# Patient Record
Sex: Male | Born: 1980 | Hispanic: No | Marital: Married | Smoking: Never smoker
Health system: Southern US, Community
[De-identification: ages and names within clinical notes are randomized; demographics above are authoritative.]

---

## 2016-04-18 ENCOUNTER — Ambulatory Visit (HOSPITAL_COMMUNITY)
Admission: EM | Admit: 2016-04-18 | Discharge: 2016-04-18 | Disposition: A | Discharge status: Home / Self Care | Payer: BLUE CROSS/BLUE SHIELD | Attending: Family Medicine | Admitting: Family Medicine

## 2016-04-18 ENCOUNTER — Encounter (HOSPITAL_COMMUNITY): Payer: Self-pay | Admitting: *Deleted

## 2016-04-18 ENCOUNTER — Ambulatory Visit (INDEPENDENT_AMBULATORY_CARE_PROVIDER_SITE_OTHER): Payer: BLUE CROSS/BLUE SHIELD

## 2016-04-18 DIAGNOSIS — F419 Anxiety disorder, unspecified: Secondary | ICD-10-CM | POA: Diagnosis not present

## 2016-04-18 DIAGNOSIS — M25512 Pain in left shoulder: Secondary | ICD-10-CM | POA: Diagnosis not present

## 2016-04-18 MED ORDER — LORAZEPAM 1 MG PO TABS: 1.0000 mg | ORAL_TABLET | Freq: Two times a day (BID) | ORAL | Status: AC | PRN

## 2016-04-18 NOTE — ED Notes (Signed)
Pt  Reports  Symptoms  Of  Anxious  Feeling    Sensation  Of tightness  In  Chest         denys  Any medical problems   denys  Any meds     He  Reports    He has  Never  Had  The  Symptoms   Before           his  Eye  Contact  Is  Fair  His  Skin is  Warm  And  Dry      He  Is    Alert  And  orieinted

## 2016-04-18 NOTE — ED Provider Notes (Signed)
CSN: [id number]     Arrival date & time 04/18/16  1744 History   None    Chief Complaint  Patient presents with  . Anxiety    Patient is a 35 y.o. male presenting with anxiety. The history is provided by the patient.  Anxiety This is a new problem. The current episode started more than 2 days ago. The problem occurs daily. The problem has been gradually worsening. Pertinent negatives include no chest pain, no abdominal pain, no headaches and no shortness of breath. Nothing relieves the symptoms.  Pt reports several days of feeling nervous and anxious. He is not sleeping well and often at night awakens and feels like he has to take a deep breath to calm himself. He admits to being under a lot of stress recently. No previos h/o anxiety issues.  Shoulder pain: Pt states he has noticed recently that he has pain with ROM of his (L) shoulder. Denies injury, tingling or numbness to LUE.  [facility].    History reviewed. No pertinent past medical history. History reviewed. No pertinent past surgical history. History reviewed. No pertinent family history. Social History  Substance Use Topics  . Smoking status: Never Smoker   . Smokeless tobacco: None  . Alcohol Use: No    Review of Systems  Respiratory: Negative for shortness of breath.   Cardiovascular: Negative for chest pain.  Gastrointestinal: Negative for abdominal pain.  Neurological: Negative for headaches.  All other systems reviewed and are negative.   Allergies  Review of patient's allergies indicates no known allergies.  Home Medications   Prior to Admission medications   Medication Sig Start Date End Date Taking? Authorizing Provider  LORazepam (ATIVAN) 1 MG tablet Take 1 tablet (1 mg total) by mouth 2 (two) times daily as needed for anxiety. 04/18/16   Leanne ChangKatherine P Schorr, NP   Meds Ordered and Administered this Visit  Medications - No data to display  BP 131/85 mmHg   Pulse 98  Temp(Src) 98.6 F (37 C) (Oral)  Resp 12  SpO2 99% No data found.   Physical Exam  Constitutional: He is oriented to person, place, and time. He appears well-developed and well-nourished. No distress.  HENT:  Head: Normocephalic.  Eyes: Conjunctivae are normal.  Cardiovascular: Normal rate and regular rhythm.   Pulmonary/Chest: Effort normal and breath sounds normal.  Neurological: He is alert and oriented to person, place, and time.  Skin: Skin is warm and dry.  Psychiatric: He has a normal mood and affect.    ED Course  Procedures (including critical care time)  Labs Review Labs Reviewed - No data to display  Imaging Review Dg Shoulder Left  04/18/2016  CLINICAL DATA:  Acute onset of left shoulder pain. Initial encounter. EXAM: LEFT SHOULDER - 2+ VIEW COMPARISON:  None. FINDINGS: There is no evidence of fracture or dislocation. The left humeral head is seated within the glenoid fossa. The acromioclavicular joint is unremarkable in appearance. No significant soft tissue abnormalities are seen. The visualized portions of the left lung are clear. IMPRESSION: No evidence of fracture or dislocation. Electronically Signed   By: Roanna RaiderJeffery  Chang M.D.   On: 04/18/2016 18:56     Visual Acuity Review  Right Eye Distance:   Left Eye Distance:   Bilateral Distance:    Right Eye Near:   Left Eye Near:    Bilateral Near:         MDM  1. Anxiety:  Recent increased life stressors. Ativan 1 mg BID PRN anxiety.  2. (L) shoulder pain: (L) shoulder xray neg. Encouraged to f/u w/ PCP on 05/04/2016 as schedueld and discuss these issues with PCP for ongoing management.     Leanne Chang, NP 04/18/16 2050

## 2016-04-18 NOTE — Discharge Instructions (Signed)
Generalized Anxiety Disorder Generalized anxiety disorder (GAD) is a mental disorder. It interferes with life functions, including relationships, work, and school. GAD is different from normal anxiety, which everyone experiences at some point in their lives in response to specific life events and activities. Normal anxiety actually helps [place] prepare for and get through these life events and activities. Normal anxiety goes away after the event or activity is over.  GAD causes anxiety that is not necessarily related to specific events or activities. It also causes excess anxiety in proportion to specific events or activities. The anxiety associated with GAD is also difficult to control. GAD can vary from mild to severe. People with severe GAD can have intense waves of anxiety with physical symptoms (panic attacks).  SYMPTOMS The anxiety and worry associated with GAD are difficult to control. This anxiety and worry are related to many life events and activities and also occur more days than not for 6 months or longer. People with GAD also have three or more of the following symptoms (one or more in children): 1. Restlessness.  2. Fatigue. 3. Difficulty concentrating.  4. Irritability. 5. Muscle tension. 6. Difficulty sleeping or unsatisfying sleep. DIAGNOSIS GAD is diagnosed through an assessment by your health care provider. Your health care provider will ask you questions aboutyour mood,physical symptoms, and events in your life. Your health care provider may ask you about your medical history and use of alcohol or drugs, including prescription medicines. Your health care provider may also do a physical exam and blood tests. Certain medical conditions and the use of certain substances can cause symptoms similar to those associated with GAD. Your health care provider may refer you to a mental health specialist for further evaluation. TREATMENT The following therapies are usually used to treat GAD:    1. Medication. Antidepressant medication usually is prescribed for long-term daily control. Antianxiety medicines may be added in severe cases, especially when panic attacks occur.  2. Talk therapy (psychotherapy). Certain types of talk therapy can be helpful in treating GAD by providing support, education, and guidance. A form of talk therapy called cognitive behavioral therapy can teach you healthy ways to think about and react to daily life events and activities. 3. Stress managementtechniques. These include yoga, meditation, and exercise and can be very helpful when they are practiced regularly. A mental health specialist can help determine which treatment is best for you. Some people see improvement with one therapy. However, other people require a combination of therapies.   This information is not intended to replace advice given to you by your health care provider. Make sure you discuss any questions you have with your health care provider.   Document Released: 03/05/2013 Document Revised: 11/29/2014 Document Reviewed: 03/05/2013 Elsevier Interactive Patient Education 2016 [employer].  Joint Pain Joint pain can be caused by many things. The joint can be bruised, infected, weak from aging, or sore from exercise. The pain will probably go away if you follow your doctor's instructions for home care. If your joint pain continues, more tests may be needed to help find the cause of your condition. HOME CARE Watch your condition for any changes. Follow these instructions as told to lessen the pain that you are feeling: 7. Take medicines only as told by your doctor. 8. Rest the sore joint for as long as told by your doctor. If your doctor tells you to, raise (elevate) the painful joint above the level of your heart while you are sitting or lying down.  9. Do not do things that cause pain or make the pain worse. 10. If told, put ice on the painful area: 1. Put ice in a plastic bag. 2. Place a  towel between your skin and the bag. 3. Leave the ice on for 20 minutes, 2-3 times per day. 11. Wear an elastic bandage, splint, or sling as told by your doctor. Loosen the bandage or splint if your fingers or toes lose feeling (become numb) and tingle, or if they turn cold and blue. 12. Begin exercising or stretching the joint as told by your doctor. Ask your doctor what types of exercise are safe for you. 13. Keep all follow-up visits as told by your doctor. This is important. GET HELP IF: 4. Your pain gets worse and medicine does not help it. 5. Your joint pain does not get better in 3 days. 6. You have more bruising or swelling. 7. You have a fever. 8. You lose 10 pounds (4.5 kg) or more without trying. GET HELP RIGHT AWAY IF: 1. You are not able to move the joint. 2. Your fingers or toes become numb or they turn cold and blue.   This information is not intended to replace advice given to you by your health care provider. Make sure you discuss any questions you have with your health care provider.   Document Released: 10/27/2009 Document Revised: 11/29/2014 Document Reviewed: 08/20/2014 Elsevier Interactive Patient Education 2016 [employer]  Panic Attacks Panic attacks are sudden, short feelings of great fear or discomfort. You may have them for no reason when you are relaxed, when you are uneasy (anxious), or when you are sleeping.  HOME CARE 14. Take all your medicines as told. 15. Check with your doctor before starting new medicines. 16. Keep all doctor visits. GET HELP IF: 9. You are not able to take your medicines as told. 10. Your symptoms do not get better. 11. Your symptoms get worse. GET HELP RIGHT AWAY IF: 3. Your attacks seem different than your normal attacks. 4. You have thoughts about hurting yourself or others. 5. You take panic attack medicine and you have a side effect. MAKE SURE YOU: 1. Understand these instructions. 2. Will watch your condition. 3. Will  get help right away if you are not doing well or get worse.   This information is not intended to replace advice given to you by your health care provider. Make sure you discuss any questions you have with your health care provider.   Document Released: 12/11/2010 Document Revised: 08/29/2013 Document Reviewed: 06/22/2013 Elsevier Interactive Patient Education 2016 [employer]  Shoulder Pain The shoulder is the joint that connects your arm to your body. Muscles and band-like tissues that connect bones to muscles (tendons) hold the joint together. Shoulder pain is felt if an injury or medical problem affects one or more parts of the shoulder. HOME CARE  17. Put ice on the sore area. 1. Put ice in a plastic bag. 2. Place a towel between your skin and the bag. 3. Leave the ice on for 15-20 minutes, 03-04 times a day for the first 2 days. 18. Stop using cold packs if they do not help with the pain. 19. If you were given something to keep your shoulder from moving (sling; shoulder immobilizer), wear it as told. Only take it off to shower or bathe. 20. Move your arm as little as possible, but keep your hand moving to prevent puffiness (swelling). 21. Squeeze a soft ball or foam  pad as much as possible to help prevent swelling. 22. Take medicine as told by your doctor. GET HELP IF: 12. You have progressing new pain in your arm, hand, or fingers. 13. Your hand or fingers get cold. 14. Your medicine does not help lessen your pain. GET HELP RIGHT AWAY IF:  6. Your arm, hand, or fingers are numb or tingling. 7. Your arm, hand, or fingers are puffy (swollen), painful, or turn white or blue. MAKE SURE YOU:  4. Understand these instructions. 5. Will watch your condition. 6. Will get help right away if you are not doing well or get worse.   This information is not intended to replace advice given to you by your health care provider. Make sure you discuss any questions you have with your health  care provider.   Document Released: 04/26/2008 Document Revised: 11/29/2014 Document Reviewed: 03/03/2015 Elsevier Interactive Patient Education 2016 [employer] Inc[employer]  Shoulder Range of Motion Exercises Shoulder range of motion (ROM) exercises are designed to keep the shoulder moving freely. They are often recommended for people who have shoulder pain. MOVEMENT EXERCISE When you are able, do this exercise 5-6 days per week, or as told by your health care provider. Work toward doing 2 sets of 10 swings. Pendulum Exercise How To Do This Exercise Lying Down 23. Lie face-down on a bed with your abdomen close to the side of the bed. 24. Let your arm hang over the side of the bed. 25. Relax your shoulder, arm, and hand. 26. Slowly and gently swing your arm forward and back. Do not use your neck muscles to swing your arm. They should be relaxed. If you are struggling to swing your arm, have someone gently swing it for you. When you do this exercise for the first time, swing your arm at a 15 degree angle for 15 seconds, or swing your arm 10 times. As pain lessens over time, increase the angle of the swing to 30-45 degrees. 27. Repeat steps 1-4 with the other arm. How To Do This Exercise While Standing 15. Stand next to a sturdy chair or table and hold on to it with your hand.  Bend forward at the waist.  Bend your knees slightly.  Relax your other arm and let it hang limp.  Relax the shoulder blade of the arm that is hanging and let it drop.  While keeping your shoulder relaxed, use body motion to swing your arm in small circles. The first time you do this exercise, swing your arm for about 30 seconds or 10 times. When you do it next time, swing your arm for a little longer.  Stand up tall and relax.  Repeat steps 1-7, this time changing the direction of the circles. 16. Repeat steps 1-8 with the other arm. STRETCHING EXERCISES Do these exercises 3-4 times per day on 5-6 days per week or as  told by your health care provider. Work toward holding the stretch for 20 seconds. Stretching Exercise 1 8. Lift your arm straight out in front of you. 9. Bend your arm 90 degrees at the elbow (right angle) so your forearm goes across your body and looks like the letter "L." 10. Use your other arm to gently pull the elbow forward and across your body. 11. Repeat steps 1-3 with the other arm. Stretching Exercise 2 You will need a towel or rope for this exercise. 7. Bend one arm behind your back with the palm facing outward. 8. Hold a towel with your  other hand. 9. Reach the arm that holds the towel above your head, and bend that arm at the elbow. Your wrist should be behind your neck. 10. Use your free hand to grab the free end of the towel. 11. With the higher hand, gently pull the towel up behind you. 12. With the lower hand, pull the towel down behind you. 13. Repeat steps 1-6 with the other arm. STRENGTHENING EXERCISES Do each of these exercises at four different times of day (sessions) every day or as told by your health care provider. To begin with, repeat each exercise 5 times (repetitions). Work toward doing 3 sets of 12 repetitions or as told by your health care provider. Strengthening Exercise 1 You will need a light weight for this activity. As you grow stronger, you may use a heavier weight. 1. Standing with a weight in your hand, lift your arm straight out to the side until it is at the same height as your shoulder. 2. Bend your arm at 90 degrees so that your fingers are pointing to the ceiling. 3. Slowly raise your hand until your arm is straight up in the air. 4. Repeat steps 1-3 with the other arm. Strengthening Exercise 2 You will need a light weight for this activity. As you grow stronger, you may use a heavier weight. 1. Standing with a weight in your hand, gradually move your straight arm in an arc, starting at your side, then out in front of you, then straight up over  your head. 2. Gradually move your other arm in an arc, starting at your side, then out in front of you, then straight up over your head. 3. Repeat steps 1-2 with the other arm. Strengthening Exercise 3 You will need an elastic band for this activity. As you grow stronger, gradually increase the size of the bands or increase the number of bands that you use at one time. 1. While standing, hold an elastic band in one hand and raise that arm up in the air. 2. With your other hand, pull down the band until that hand is by your side. 3. Repeat steps 1-2 with the other arm.   This information is not intended to replace advice given to you by your health care provider. Make sure you discuss any questions you have with your health care provider.   Document Released: 08/07/2003 Document Revised: 03/25/2015 Document Reviewed: 11/04/2014 Elsevier Interactive Patient Education [employer]

## 2017-06-16 IMAGING — DX DG SHOULDER 2+V*L*
4 series · 4 of 4 positions shown · non-contrast
Comparison: None.

CLINICAL DATA: Acute onset of left shoulder pain. Initial
encounter.

EXAM:
LEFT SHOULDER - 2+ VIEW

[shoulder ap (1 of 2)]
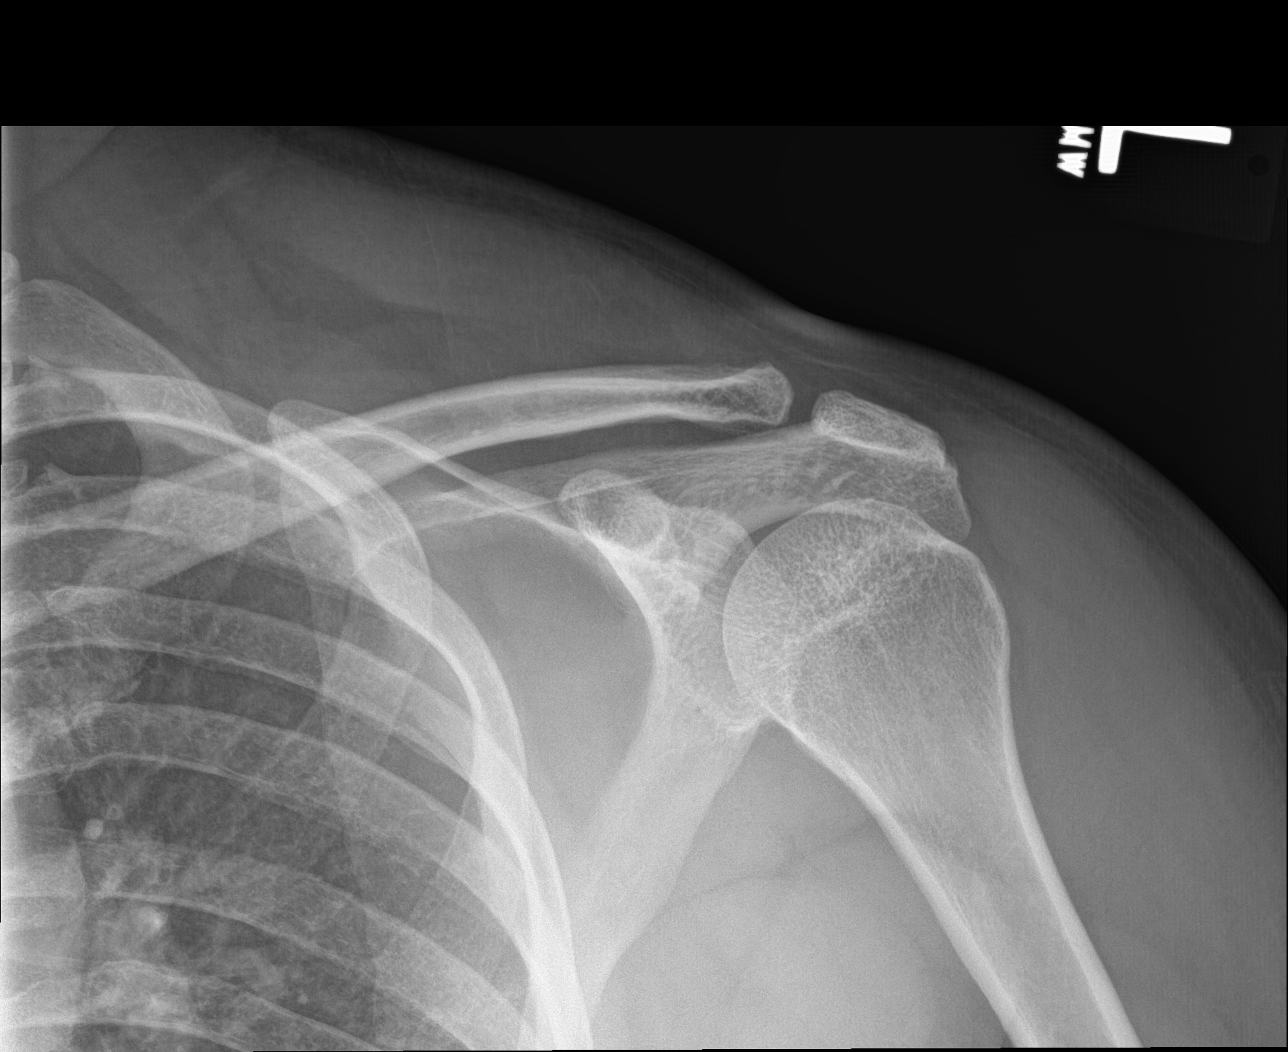

[shoulder ap (2 of 2)]
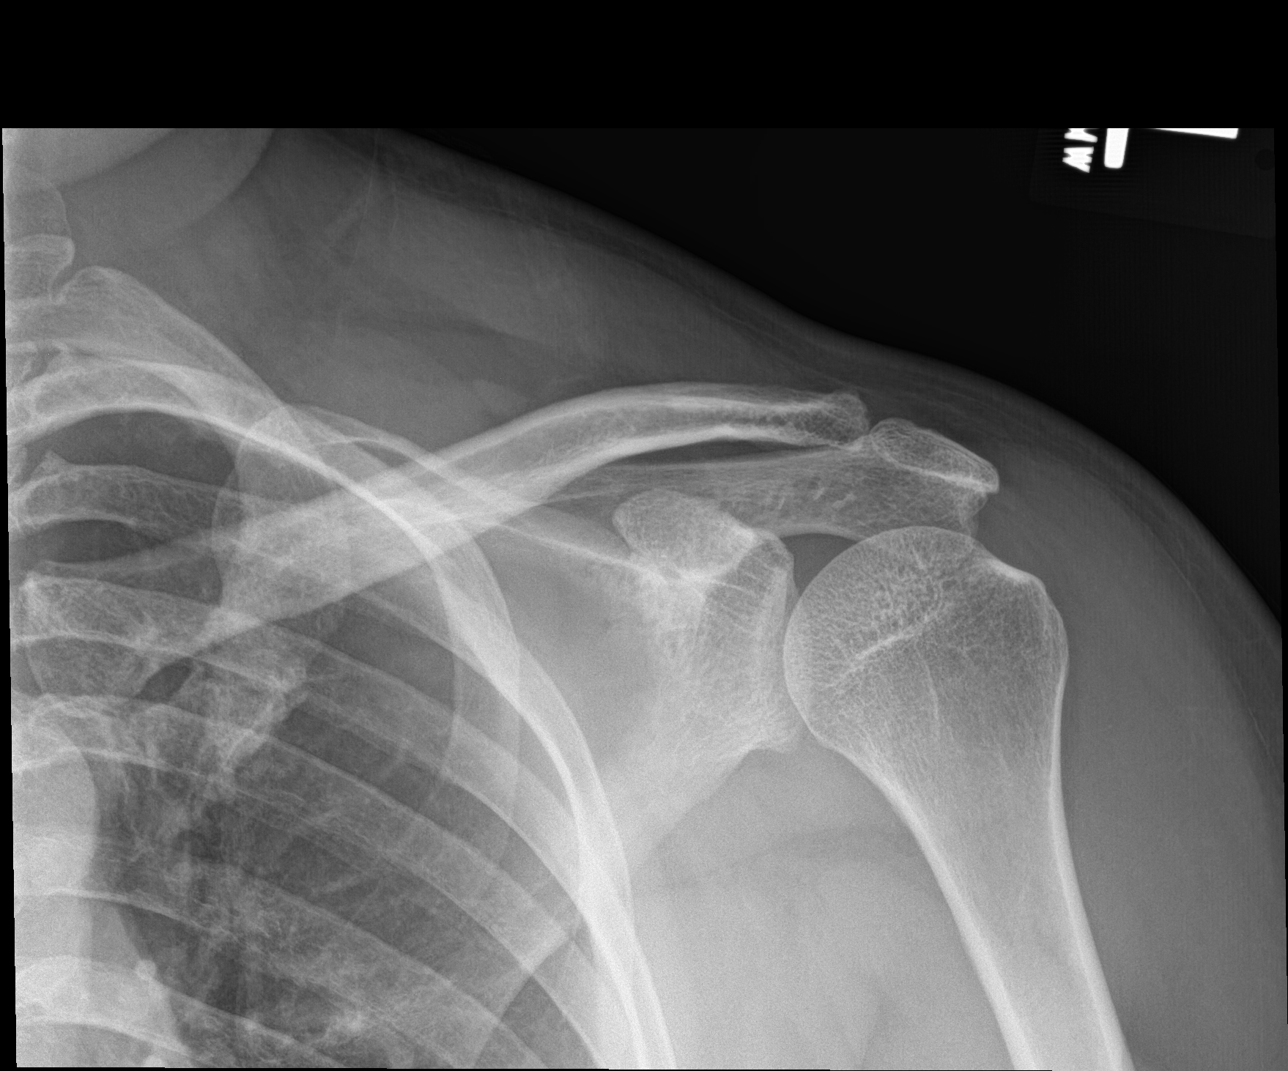

[shoulder y view]
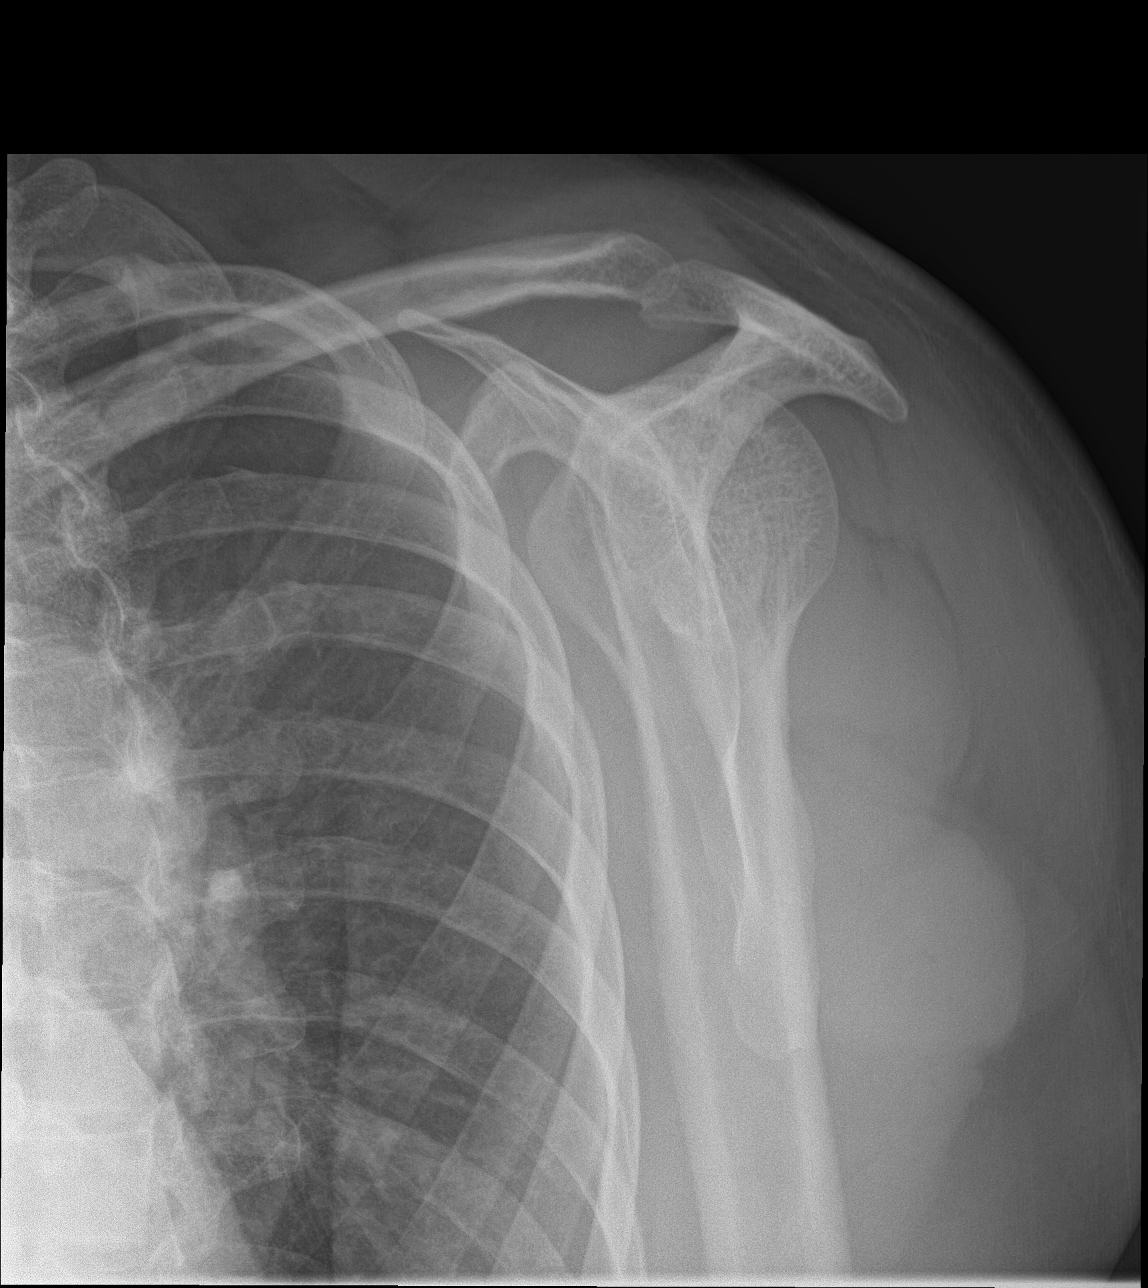

[shoulder axillary]
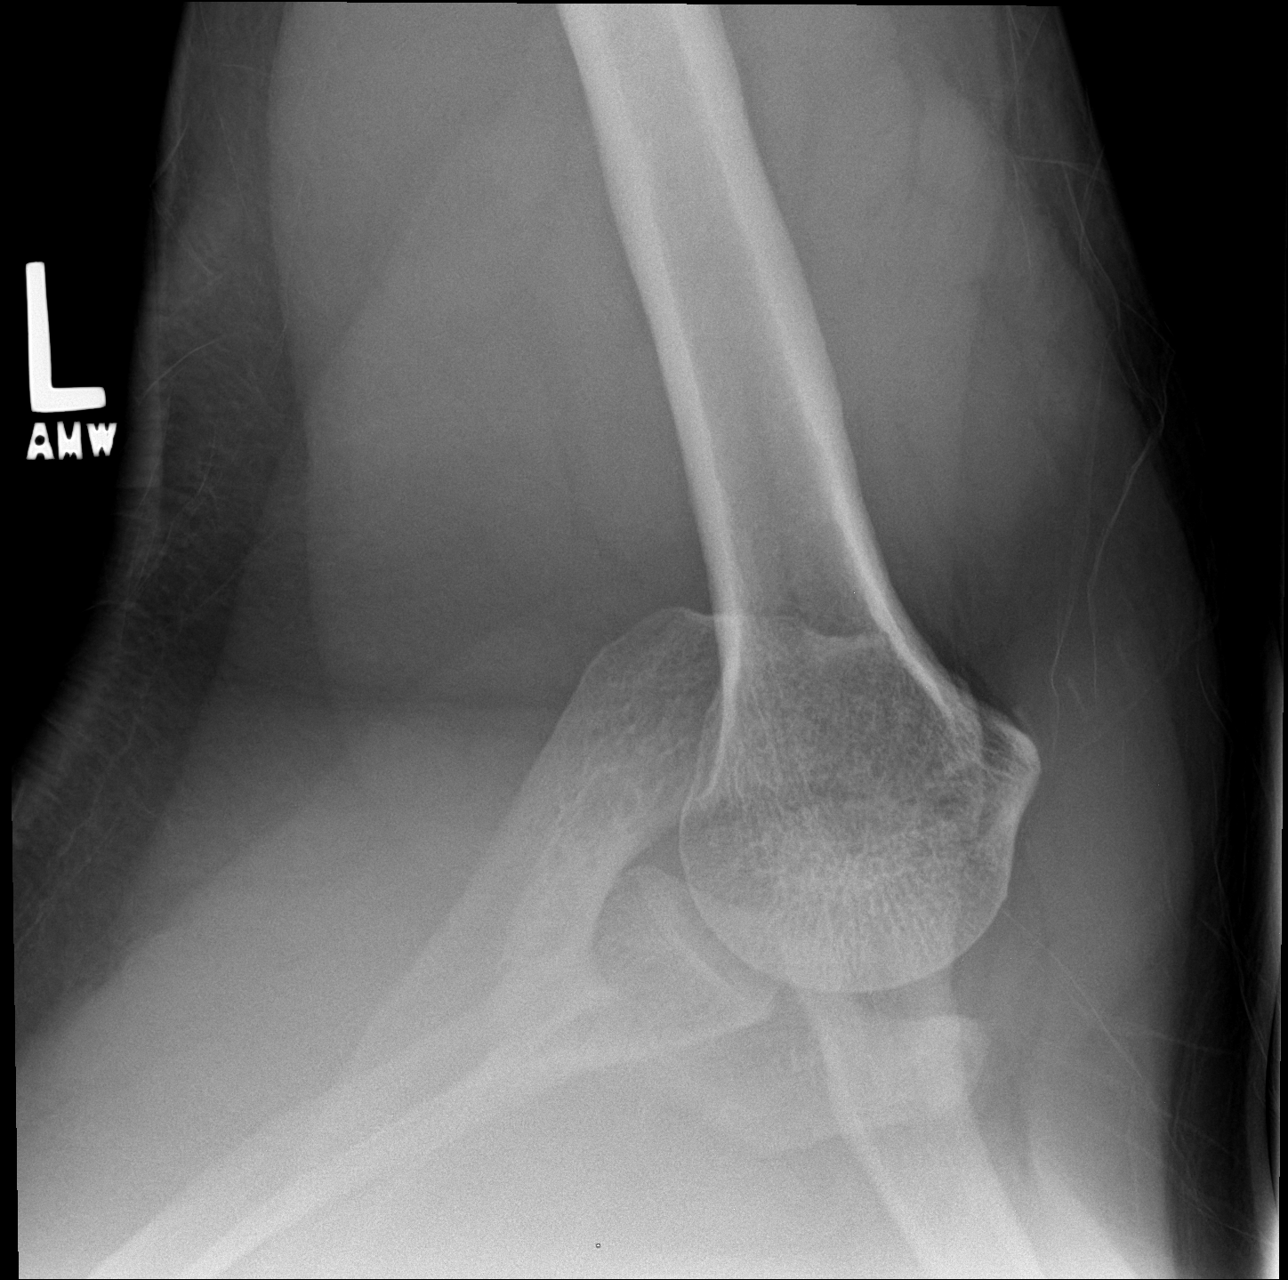

[4 of 4 positions shown; findings below may reference images not displayed]

FINDINGS: There is no evidence of fracture or dislocation. The left humeral
head is seated within the glenoid fossa. The acromioclavicular joint
is unremarkable in appearance. No significant soft tissue
abnormalities are seen. The visualized portions of the left lung are
clear.
IMPRESSION: No evidence of fracture or dislocation.

## 2023-01-03 DIAGNOSIS — R062 Wheezing: Secondary | ICD-10-CM | POA: Diagnosis not present

## 2023-01-04 DIAGNOSIS — R1013 Epigastric pain: Secondary | ICD-10-CM | POA: Diagnosis not present

## 2023-01-04 DIAGNOSIS — T887XXA Unspecified adverse effect of drug or medicament, initial encounter: Secondary | ICD-10-CM | POA: Diagnosis not present

## 2023-01-04 DIAGNOSIS — E785 Hyperlipidemia, unspecified: Secondary | ICD-10-CM | POA: Diagnosis not present

## 2023-01-04 DIAGNOSIS — E1165 Type 2 diabetes mellitus with hyperglycemia: Secondary | ICD-10-CM | POA: Diagnosis not present

## 2023-01-14 DIAGNOSIS — R1013 Epigastric pain: Secondary | ICD-10-CM | POA: Diagnosis not present

## 2023-01-14 DIAGNOSIS — K8020 Calculus of gallbladder without cholecystitis without obstruction: Secondary | ICD-10-CM | POA: Diagnosis not present

## 2023-01-14 DIAGNOSIS — D649 Anemia, unspecified: Secondary | ICD-10-CM | POA: Diagnosis not present

## 2023-01-14 DIAGNOSIS — D519 Vitamin B12 deficiency anemia, unspecified: Secondary | ICD-10-CM | POA: Diagnosis not present

## 2023-01-14 DIAGNOSIS — K7689 Other specified diseases of liver: Secondary | ICD-10-CM | POA: Diagnosis not present

## 2023-03-22 DIAGNOSIS — K8010 Calculus of gallbladder with chronic cholecystitis without obstruction: Secondary | ICD-10-CM | POA: Diagnosis not present

## 2023-09-01 DIAGNOSIS — E1165 Type 2 diabetes mellitus with hyperglycemia: Secondary | ICD-10-CM | POA: Diagnosis not present

## 2023-09-01 DIAGNOSIS — I10 Essential (primary) hypertension: Secondary | ICD-10-CM | POA: Diagnosis not present

## 2023-09-01 DIAGNOSIS — K625 Hemorrhage of anus and rectum: Secondary | ICD-10-CM | POA: Diagnosis not present

## 2023-09-01 DIAGNOSIS — E785 Hyperlipidemia, unspecified: Secondary | ICD-10-CM | POA: Diagnosis not present

## 2023-09-19 DIAGNOSIS — Z23 Encounter for immunization: Secondary | ICD-10-CM | POA: Diagnosis not present

## 2023-09-19 DIAGNOSIS — Z0000 Encounter for general adult medical examination without abnormal findings: Secondary | ICD-10-CM | POA: Diagnosis not present

## 2023-09-19 DIAGNOSIS — E1169 Type 2 diabetes mellitus with other specified complication: Secondary | ICD-10-CM | POA: Diagnosis not present

## 2023-09-19 DIAGNOSIS — E119 Type 2 diabetes mellitus without complications: Secondary | ICD-10-CM | POA: Diagnosis not present

## 2023-09-19 DIAGNOSIS — E785 Hyperlipidemia, unspecified: Secondary | ICD-10-CM | POA: Diagnosis not present

## 2024-01-16 DIAGNOSIS — R058 Other specified cough: Secondary | ICD-10-CM | POA: Diagnosis not present

## 2024-01-16 DIAGNOSIS — K2100 Gastro-esophageal reflux disease with esophagitis, without bleeding: Secondary | ICD-10-CM | POA: Diagnosis not present

## 2024-02-07 DIAGNOSIS — R0989 Other specified symptoms and signs involving the circulatory and respiratory systems: Secondary | ICD-10-CM | POA: Diagnosis not present

## 2024-02-07 DIAGNOSIS — D649 Anemia, unspecified: Secondary | ICD-10-CM | POA: Diagnosis not present

## 2024-02-07 DIAGNOSIS — K219 Gastro-esophageal reflux disease without esophagitis: Secondary | ICD-10-CM | POA: Diagnosis not present

## 2024-02-07 DIAGNOSIS — R09A2 Foreign body sensation, throat: Secondary | ICD-10-CM | POA: Diagnosis not present

## 2024-03-26 DIAGNOSIS — E1169 Type 2 diabetes mellitus with other specified complication: Secondary | ICD-10-CM | POA: Diagnosis not present

## 2024-03-26 DIAGNOSIS — E559 Vitamin D deficiency, unspecified: Secondary | ICD-10-CM | POA: Diagnosis not present

## 2024-03-26 DIAGNOSIS — I10 Essential (primary) hypertension: Secondary | ICD-10-CM | POA: Diagnosis not present

## 2024-03-26 DIAGNOSIS — E785 Hyperlipidemia, unspecified: Secondary | ICD-10-CM | POA: Diagnosis not present

## 2024-04-20 DIAGNOSIS — R1310 Dysphagia, unspecified: Secondary | ICD-10-CM | POA: Diagnosis not present

## 2024-04-20 DIAGNOSIS — K2970 Gastritis, unspecified, without bleeding: Secondary | ICD-10-CM | POA: Diagnosis not present

## 2024-04-20 DIAGNOSIS — K2289 Other specified disease of esophagus: Secondary | ICD-10-CM | POA: Diagnosis not present

## 2024-04-20 DIAGNOSIS — K222 Esophageal obstruction: Secondary | ICD-10-CM | POA: Diagnosis not present

## 2024-04-20 DIAGNOSIS — K2930 Chronic superficial gastritis without bleeding: Secondary | ICD-10-CM | POA: Diagnosis not present

## 2024-04-20 DIAGNOSIS — K449 Diaphragmatic hernia without obstruction or gangrene: Secondary | ICD-10-CM | POA: Diagnosis not present

## 2024-06-26 DIAGNOSIS — M5412 Radiculopathy, cervical region: Secondary | ICD-10-CM | POA: Diagnosis not present

## 2024-07-20 ENCOUNTER — Other Ambulatory Visit: Payer: Self-pay | Admitting: Internal Medicine

## 2024-07-20 ENCOUNTER — Ambulatory Visit
Admission: RE | Admit: 2024-07-20 | Discharge: 2024-07-20 | Disposition: A | Discharge status: Home / Self Care | Source: Ambulatory Visit | Attending: Internal Medicine | Admitting: Internal Medicine

## 2024-07-20 DIAGNOSIS — M25512 Pain in left shoulder: Secondary | ICD-10-CM

## 2024-07-20 DIAGNOSIS — M5412 Radiculopathy, cervical region: Secondary | ICD-10-CM | POA: Diagnosis not present

## 2024-07-20 DIAGNOSIS — E559 Vitamin D deficiency, unspecified: Secondary | ICD-10-CM | POA: Diagnosis not present

## 2024-07-20 DIAGNOSIS — E1165 Type 2 diabetes mellitus with hyperglycemia: Secondary | ICD-10-CM | POA: Diagnosis not present

## 2024-07-20 DIAGNOSIS — R899 Unspecified abnormal finding in specimens from other organs, systems and tissues: Secondary | ICD-10-CM | POA: Diagnosis not present

## 2024-07-20 DIAGNOSIS — J9801 Acute bronchospasm: Secondary | ICD-10-CM

## 2024-10-10 DIAGNOSIS — Z0000 Encounter for general adult medical examination without abnormal findings: Secondary | ICD-10-CM | POA: Diagnosis not present

## 2024-10-10 DIAGNOSIS — E785 Hyperlipidemia, unspecified: Secondary | ICD-10-CM | POA: Diagnosis not present

## 2024-10-10 DIAGNOSIS — E559 Vitamin D deficiency, unspecified: Secondary | ICD-10-CM | POA: Diagnosis not present

## 2024-10-10 DIAGNOSIS — E669 Obesity, unspecified: Secondary | ICD-10-CM | POA: Diagnosis not present

## 2024-10-10 DIAGNOSIS — I10 Essential (primary) hypertension: Secondary | ICD-10-CM | POA: Diagnosis not present

## 2024-10-10 DIAGNOSIS — E1165 Type 2 diabetes mellitus with hyperglycemia: Secondary | ICD-10-CM | POA: Diagnosis not present
